# Patient Record
Sex: Female | Born: 1985
Health system: Southern US, Community
[De-identification: ages and names within clinical notes are randomized; demographics above are authoritative.]

## PROBLEM LIST (undated history)

## (undated) ENCOUNTER — Inpatient Hospital Stay (HOSPITAL_COMMUNITY): Payer: Self-pay

## (undated) HISTORY — PX: WISDOM TOOTH EXTRACTION: SHX21

---

## 2007-03-07 ENCOUNTER — Emergency Department (HOSPITAL_COMMUNITY): Admission: EM | Admit: 2007-03-07 | Discharge: 2007-03-07 | Payer: Self-pay | Admitting: Emergency Medicine

## 2009-01-15 ENCOUNTER — Encounter: Admission: RE | Admit: 2009-01-15 | Discharge: 2009-01-15 | Payer: Self-pay | Admitting: Chiropractic Medicine

## 2010-07-11 IMAGING — CR DG THORACOLUMBAR SPINE STANDING SCOLIOSIS
1 series · 3 of 3 positions shown · non-contrast
Comparison: None

CLINICAL DATA: Headache, cervical radiculitis, and parasthesias.

THORACOLUMBAR SCOLIOSIS STUDY - STANDING VIEWS

[Series 1001: view not recorded · 0.40mm/px · 3 of 3 slices shown]
[im 1/3]
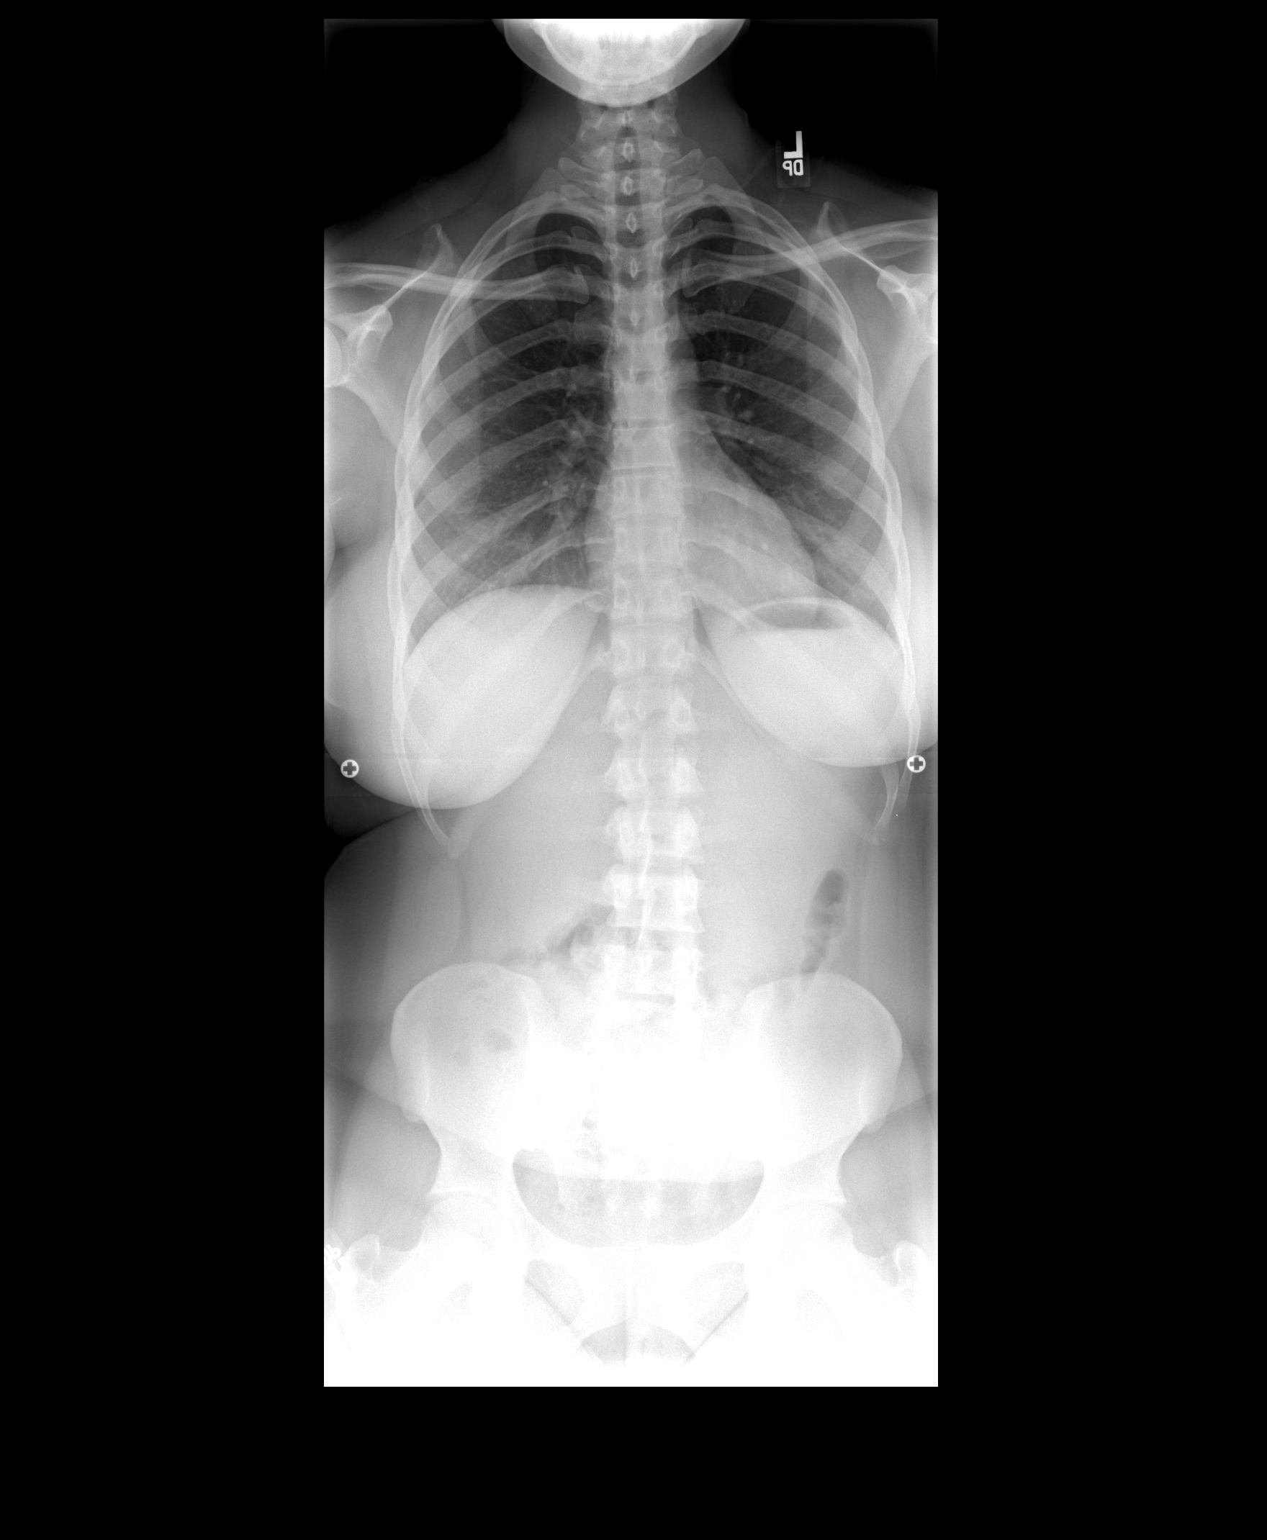
[im 2/3]
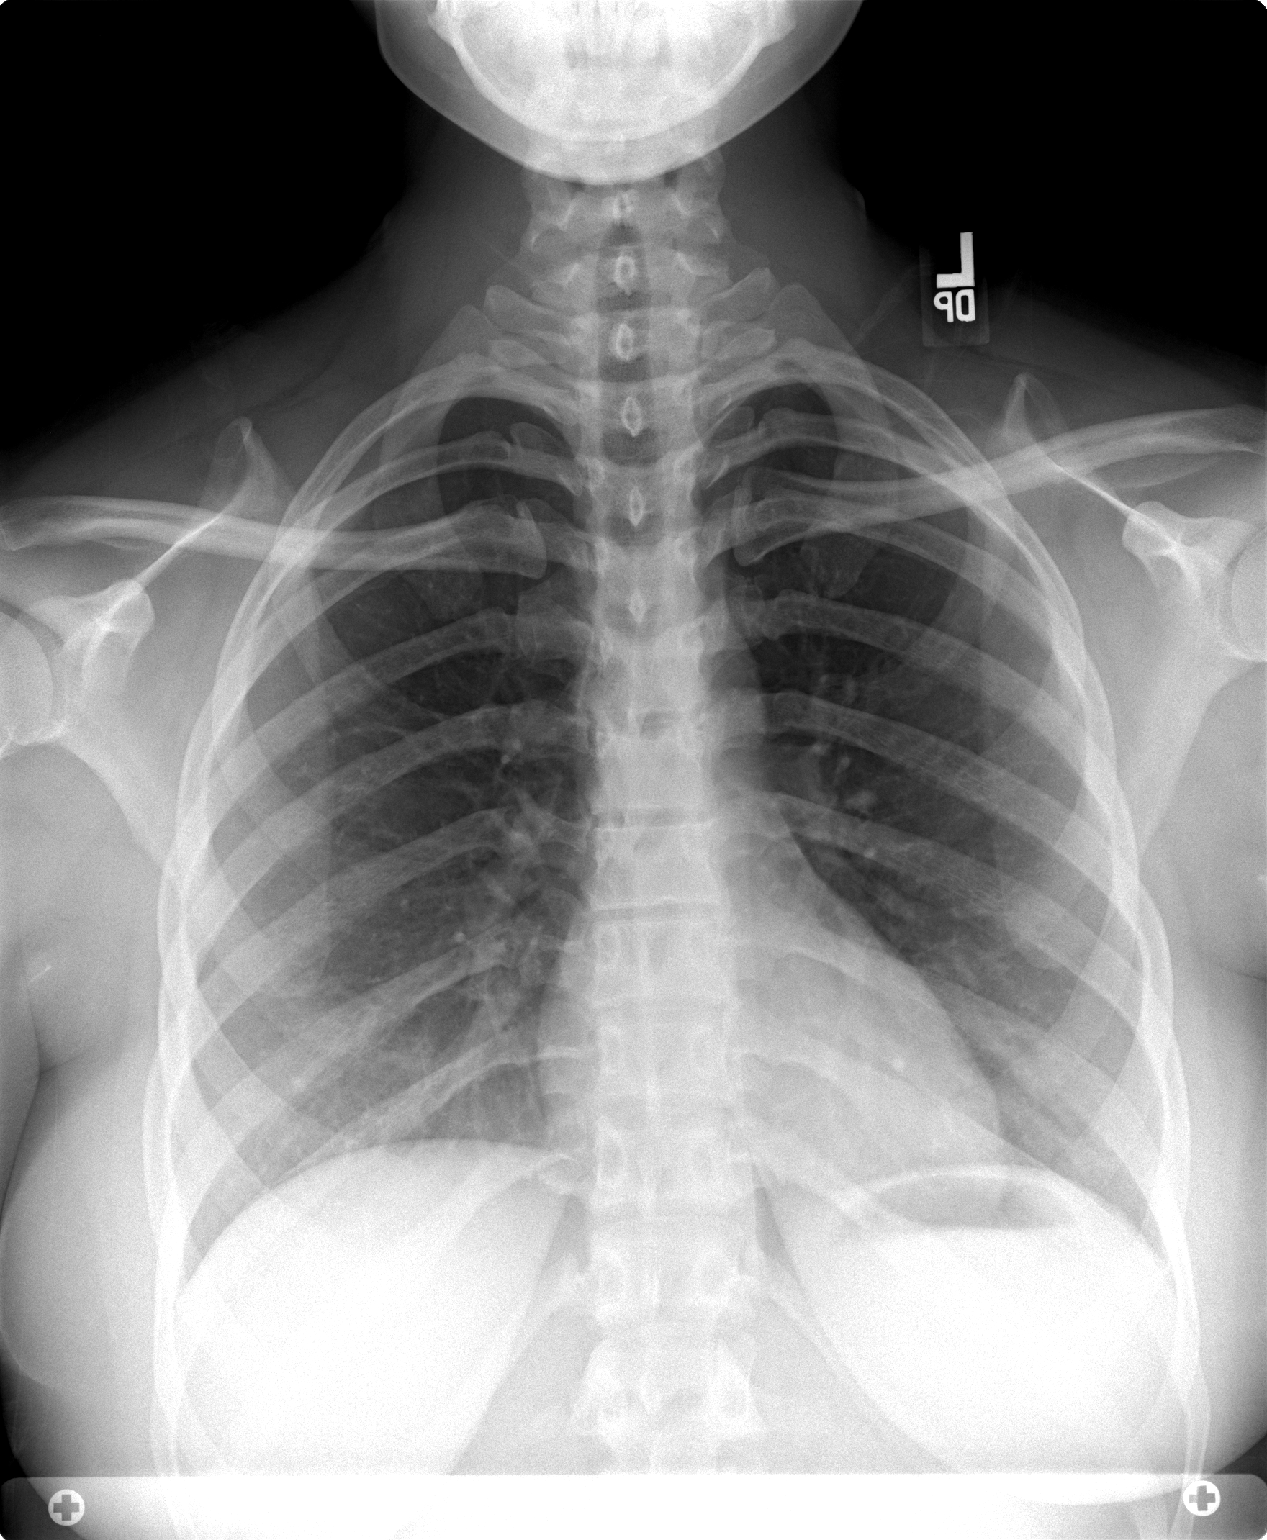
[im 3/3]
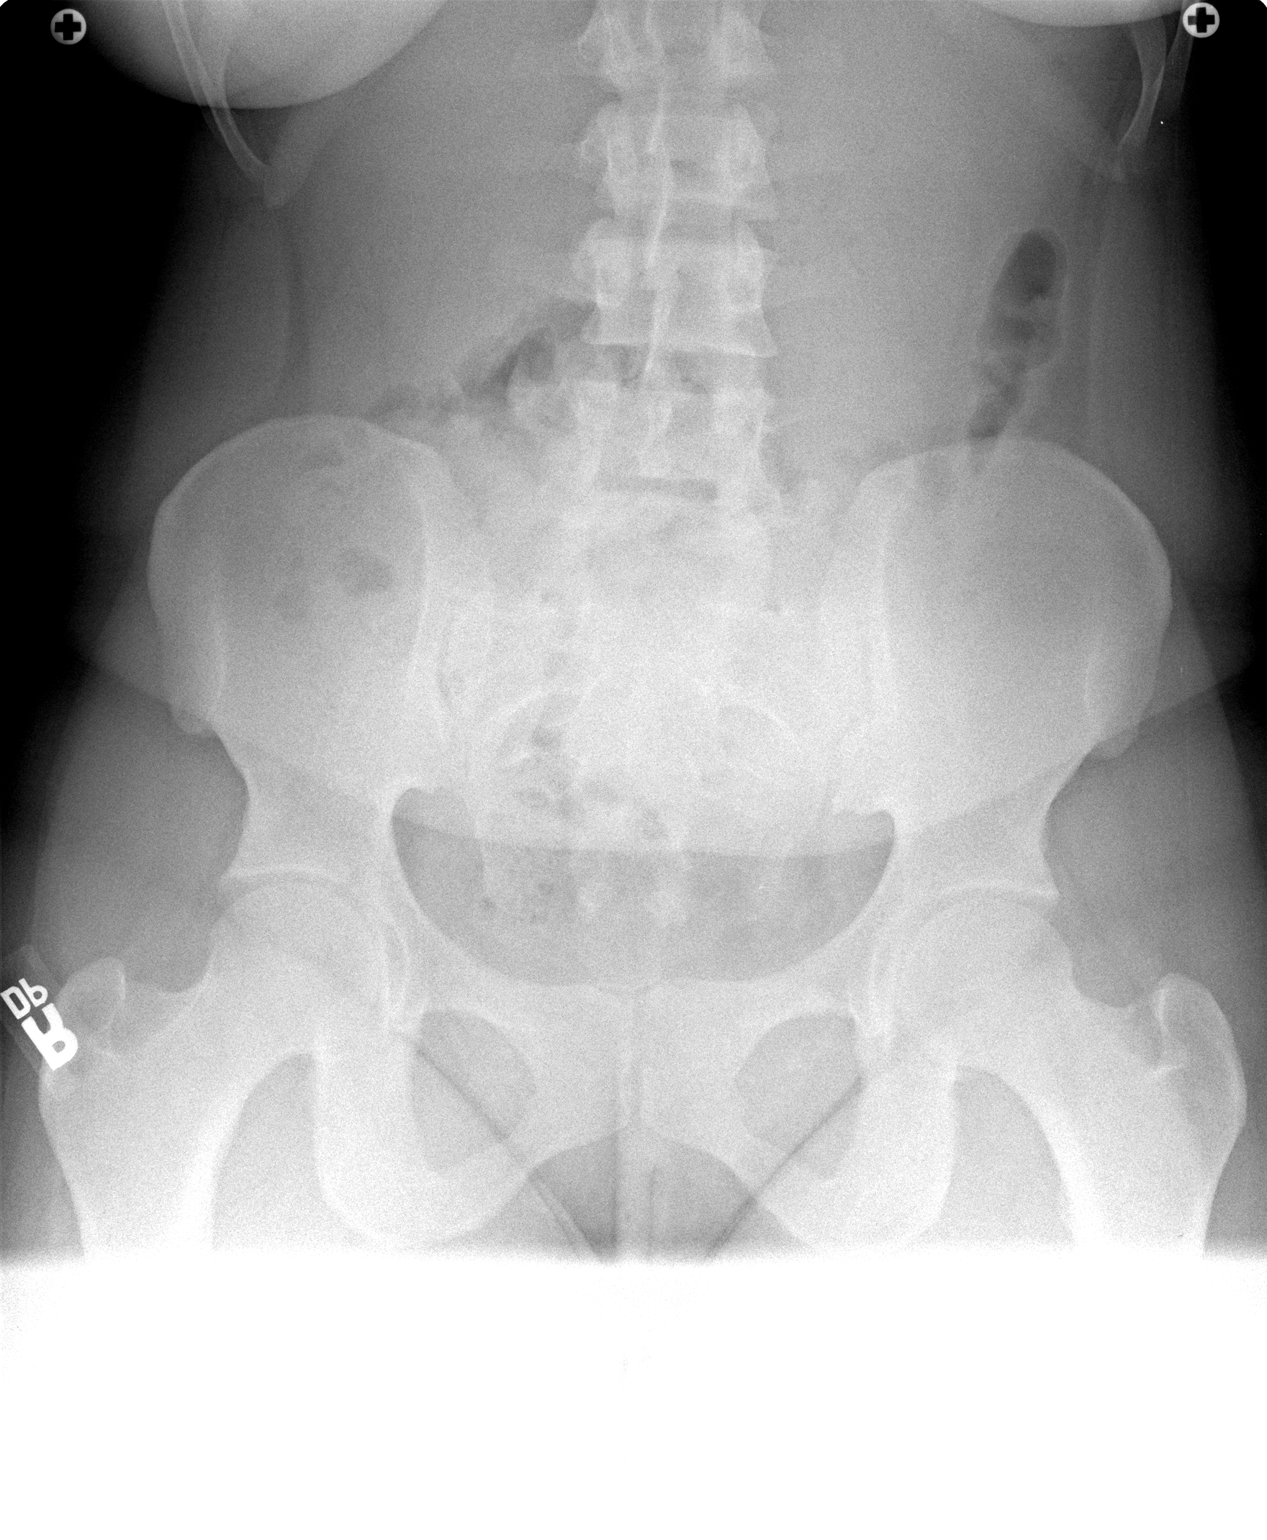

[3 of 3 positions shown; findings below may reference images not displayed]

FINDINGS: There is a very slight rotoscoliosis of the lumbar spine
with a less than 3 degree curvature to the left centered at T12.
The bony structures are otherwise normal.
IMPRESSION: Minimal rotoscoliosis of the lumbar spine.

## 2011-05-22 LAB — URINALYSIS, ROUTINE W REFLEX MICROSCOPIC
Bilirubin Urine: NEGATIVE
Ketones, ur: NEGATIVE
Nitrite: NEGATIVE
Protein, ur: NEGATIVE
pH: 6

## 2011-05-22 LAB — POCT URINALYSIS DIP (DEVICE)
Ketones, ur: NEGATIVE
Operator id: 239701
Urobilinogen, UA: 1
pH: 6.5

## 2011-05-22 LAB — URINE MICROSCOPIC-ADD ON

## 2011-05-22 LAB — POCT PREGNANCY, URINE
Operator id: 239701
Preg Test, Ur: NEGATIVE

## 2011-05-22 LAB — URINE CULTURE: Colony Count: >=100000

## 2017-01-02 ENCOUNTER — Encounter (HOSPITAL_COMMUNITY): Payer: Self-pay

## 2017-01-02 ENCOUNTER — Emergency Department (HOSPITAL_COMMUNITY)

## 2017-01-02 ENCOUNTER — Emergency Department (HOSPITAL_COMMUNITY)
Admission: EM | Admit: 2017-01-02 | Discharge: 2017-01-02 | Disposition: A | Discharge status: Home / Self Care | Attending: Emergency Medicine | Admitting: Emergency Medicine

## 2017-01-02 ENCOUNTER — Emergency Department (HOSPITAL_COMMUNITY): Admission: EM | Admit: 2017-01-02 | Payer: Self-pay | Source: Home / Self Care

## 2017-01-02 DIAGNOSIS — W108XXA Fall (on) (from) other stairs and steps, initial encounter: Secondary | ICD-10-CM | POA: Insufficient documentation

## 2017-01-02 DIAGNOSIS — S20211A Contusion of right front wall of thorax, initial encounter: Secondary | ICD-10-CM

## 2017-01-02 DIAGNOSIS — Y999 Unspecified external cause status: Secondary | ICD-10-CM | POA: Diagnosis not present

## 2017-01-02 DIAGNOSIS — Y929 Unspecified place or not applicable: Secondary | ICD-10-CM | POA: Insufficient documentation

## 2017-01-02 DIAGNOSIS — S199XXA Unspecified injury of neck, initial encounter: Secondary | ICD-10-CM | POA: Diagnosis present

## 2017-01-02 DIAGNOSIS — Y939 Activity, unspecified: Secondary | ICD-10-CM | POA: Insufficient documentation

## 2017-01-02 DIAGNOSIS — W19XXXA Unspecified fall, initial encounter: Secondary | ICD-10-CM

## 2017-01-02 DIAGNOSIS — S161XXA Strain of muscle, fascia and tendon at neck level, initial encounter: Secondary | ICD-10-CM | POA: Diagnosis not present

## 2017-01-02 LAB — POC URINE PREG, ED: Preg Test, Ur: NEGATIVE

## 2017-01-02 MED ORDER — CYCLOBENZAPRINE HCL 5 MG PO TABS: 5.0000 mg | ORAL_TABLET | Freq: Three times a day (TID) | ORAL | 0 refills | Status: AC | PRN

## 2017-01-02 MED ORDER — CYCLOBENZAPRINE HCL 10 MG PO TABS
10.0000 mg | ORAL_TABLET | Freq: Once | ORAL | Status: AC
  Administered 2017-01-02: 10 mg via ORAL
  Filled 2017-01-02: qty 1

## 2017-01-02 MED ORDER — DICLOFENAC SODIUM 50 MG PO TBEC: 50.0000 mg | DELAYED_RELEASE_TABLET | Freq: Two times a day (BID) | ORAL | 0 refills | Status: AC

## 2017-01-02 NOTE — ED Notes (Signed)
Pt to xray

## 2017-01-02 NOTE — ED Provider Notes (Signed)
[facility]-EMERGENCY DEPT Provider Note   CSN: [id number] Arrival date & time: 01/02/17  1433  By signing my name below, I, [name], attest that this documentation has been prepared under the direction and in the presence of non-physician practitioner, Kerrie Buffalo, NP. Electronically Signed: [name], ED Scribe. 01/02/17. 4:24 PM.  History   Chief Complaint Chief Complaint  Patient presents with  . Fall    The history is provided by the patient. No language interpreter was used.    HPI Comments: Julia Woodward is an otherwise healthy 31 y.o. female who presents to the Emergency Department complaining of a sudden onset, mechanical fall that occurred 9 hours ago. Pt reports she was walking down the stairs when she slipped and fell backwards landing on her back and hitting her head. No LOC. Pt reports associated pain in her mid to upper back, left elbow, left shoulder and head. No treatments tried prior to arrival in The ED. Pt denies neck pain, epistaxis, nausea, vomiting, visual  disturbance, dental pain, urinary or bowel incontinence, or any other complaints at this time. NKDA  History reviewed. No pertinent past medical history.  There are no active problems to display for this patient.   Past Surgical History:  Procedure Laterality Date  . WISDOM TOOTH EXTRACTION      OB History    No data available       Home Medications    Prior to Admission medications   Medication Sig Start Date End Date Taking? Authorizing Provider  cyclobenzaprine (FLEXERIL) 5 MG tablet Take 1 tablet (5 mg total) by mouth 3 (three) times daily as needed. 01/02/17   Janne Napoleon, NP  diclofenac (VOLTAREN) 50 MG EC tablet Take 1 tablet (50 mg total) by mouth 2 (two) times daily. 01/02/17   Janne Napoleon, NP    Family History No family history on file.  Social History Social History  Substance Use Topics  . Smoking status: Never Smoker  . Smokeless tobacco: Never Used  . Alcohol  use Yes     Allergies   Patient has no known allergies.   Review of Systems Review of Systems  HENT: Negative for dental problem, ear pain and nosebleeds.   Eyes: Negative for visual disturbance.  Gastrointestinal: Negative for nausea and vomiting.  Genitourinary: Negative for difficulty urinating.  Musculoskeletal: Positive for arthralgias, back pain and myalgias. Negative for neck pain.  Neurological: Negative for syncope.     Physical Exam Updated Vital Signs BP 115/63 (BP Location: Left Arm)   Pulse 75   Temp 97.9 F (36.6 C) (Oral)   Resp 16   Ht 5\' 6"  (1.676 m)   Wt 99.8 kg (220 lb)   SpO2 97%   BMI 35.51 kg/m   Physical Exam  Constitutional: She is oriented to person, place, and time. She appears well-developed and well-nourished.  HENT:  Head: Normocephalic and atraumatic.  Right Ear: Tympanic membrane normal.  Left Ear: Tympanic membrane normal.  Nose: Nose normal.  No bleeding from the nose.   Eyes: Conjunctivae and EOM are normal. Pupils are equal, round, and reactive to light. No scleral icterus.  Neck: Trachea normal. Neck supple. Spinous process tenderness and muscular tenderness present.  Cardiovascular: Normal rate and regular rhythm.   Pulmonary/Chest: Effort normal and breath sounds normal.  Lungs are clear.   Abdominal: Soft. There is no tenderness.  Musculoskeletal: She exhibits tenderness.  Tender to the right posterior ribs. Tender over the cervical spine.  Neurological:  She is alert and oriented to person, place, and time. Coordination normal.  Grips are equal. Radial pulse is 2+. Reflexes are 2 +. Ambulatory with a steady gait, no foot drag. Stands on 1 foot without difficulty. Negative Romberg's Test.   Skin: Skin is warm and dry.  Psychiatric: She has a normal mood and affect. Her behavior is normal.  Nursing note and vitals reviewed.    ED Treatments / Results  DIAGNOSTIC STUDIES: Oxygen Saturation is 97% on RA, normal by my  interpretation.   COORDINATION OF CARE: 4:17 PM-Discussed next steps with pt including XR of the head and neck. Pt verbalized understanding and is agreeable with the plan.   Labs (all labs ordered are listed, but only abnormal results are displayed) Labs Reviewed  POC URINE PREG, ED   Radiology Dg Ribs Unilateral W/chest Right  Result Date: 01/02/2017 CLINICAL DATA:  As post falling downstairs striking the head and back on the stairs. Posterior rib discomfort. EXAM: RIGHT RIBS AND CHEST - 3+ VIEW COMPARISON:  Scoliosis series of January 15, 2009 FINDINGS: The lungs are adequately inflated and clear. The heart and pulmonary vascularity are normal. The mediastinum is normal in width. There is no pleural effusion. Right rib detail images reveal no acute displaced fractures. IMPRESSION: There is no acute cardiopulmonary abnormality. No acute right rib fracture is observed. Electronically Signed   By: David  Swazil[place] M.D.   On: 01/02/2017 17:10   Dg Cervical Spine Complete  Result Date: 01/02/2017 CLINICAL DATA:  31 y/o  F; status post fall with injury to head. EXAM: CERVICAL SPINE - COMPLETE 4+ VIEW COMPARISON:  01/15/2009 cervical radiographs FINDINGS: There is no evidence of cervical spine fracture or prevertebral soft tissue swelling. Straightening of cervical lordosis. No other significant bone abnormalities are identified. IMPRESSION: No acute fracture or dislocation identified. Electronically Signed   By: Mitzi HansenLance  Furusawa-Stratton M.D.   On: 01/02/2017 17:11    Procedures Procedures (including critical care time)  Medications Ordered in ED Medications  cyclobenzaprine (FLEXERIL) tablet 10 mg (not administered)     Initial Impression / Assessment and Plan / ED Course  I have reviewed the triage vital signs and the nursing notes.  Pertinent labs & imaging results that were available during my care of the patient were reviewed by me and considered in my medical decision making (see chart  for details).    Patient with neck pain.  No neurological deficits and normal neuro exam.  Patient is ambulatory.  No loss of bowel or bladder control.  No concern for cauda equina.    Supportive care and return precaution discussed. Appears safe for discharge at this time. Follow up as indicated in discharge paperwork.   Final Clinical Impressions(s) / ED Diagnoses   Final diagnoses:  Fall, initial encounter  Rib contusion, right, initial encounter  Cervical strain, acute, initial encounter    New Prescriptions New Prescriptions   CYCLOBENZAPRINE (FLEXERIL) 5 MG TABLET    Take 1 tablet (5 mg total) by mouth 3 (three) times daily as needed.   DICLOFENAC (VOLTAREN) 50 MG EC TABLET    Take 1 tablet (50 mg total) by mouth 2 (two) times daily.   I personally performed the services described in this documentation, which was scribed in my presence. The recorded information has been reviewed and is accurate.    Kerrie Buffaloeese, Buster Schueller [place], [place] 01/02/17 1731    Shaune PollackIsaacs, [place], MD 01/03/17 1325

## 2017-01-02 NOTE — ED Notes (Signed)
Patient ambulated to room from waiting without difficulty while texting on her phone.

## 2017-01-02 NOTE — ED Triage Notes (Signed)
Per Pt, pt was walking down steps when her shoes slipped in some water and she fell back. Pt denies LOC, but reports tenderness to the back of her head, left elbow, left knee and right heel. Pt was able to ambulate to the room. Alert and oriented x3.
